# Patient Record
Sex: Female | Born: 1970 | Race: White | Hispanic: No | Marital: Single | Smoking: Never smoker
Health system: Southern US, Community
[De-identification: ages and names within clinical notes are randomized; demographics above are authoritative.]

## PROBLEM LIST (undated history)

## (undated) DIAGNOSIS — R87619 Unspecified abnormal cytological findings in specimens from cervix uteri: Secondary | ICD-10-CM

## (undated) DIAGNOSIS — IMO0002 Reserved for concepts with insufficient information to code with codable children: Secondary | ICD-10-CM

## (undated) HISTORY — DX: Unspecified abnormal cytological findings in specimens from cervix uteri: R87.619

## (undated) HISTORY — PX: WISDOM TOOTH EXTRACTION: SHX21

## (undated) HISTORY — PX: TONSILLECTOMY: SUR1361

## (undated) HISTORY — DX: Reserved for concepts with insufficient information to code with codable children: IMO0002

---

## 2004-06-29 ENCOUNTER — Encounter (INDEPENDENT_AMBULATORY_CARE_PROVIDER_SITE_OTHER): Payer: Self-pay | Admitting: Specialist

## 2004-06-29 ENCOUNTER — Other Ambulatory Visit: Admission: RE | Admit: 2004-06-29 | Discharge: 2004-06-29 | Payer: Self-pay | Admitting: Obstetrics and Gynecology

## 2004-06-29 ENCOUNTER — Encounter: Admission: RE | Admit: 2004-06-29 | Discharge: 2004-06-29 | Payer: Self-pay | Admitting: Obstetrics and Gynecology

## 2005-01-11 ENCOUNTER — Encounter: Payer: Self-pay | Admitting: Family Medicine

## 2005-01-11 ENCOUNTER — Ambulatory Visit: Payer: Self-pay | Admitting: Obstetrics and Gynecology

## 2005-11-29 ENCOUNTER — Other Ambulatory Visit: Admission: RE | Admit: 2005-11-29 | Discharge: 2005-11-29 | Payer: Self-pay | Admitting: Family Medicine

## 2005-11-29 ENCOUNTER — Ambulatory Visit: Payer: Self-pay | Admitting: Family Medicine

## 2005-11-29 ENCOUNTER — Encounter (INDEPENDENT_AMBULATORY_CARE_PROVIDER_SITE_OTHER): Payer: Self-pay | Admitting: Specialist

## 2006-07-11 ENCOUNTER — Encounter (INDEPENDENT_AMBULATORY_CARE_PROVIDER_SITE_OTHER): Payer: Self-pay | Admitting: Obstetrics & Gynecology

## 2006-07-11 ENCOUNTER — Ambulatory Visit: Payer: Self-pay | Admitting: Obstetrics & Gynecology

## 2007-08-06 ENCOUNTER — Encounter: Payer: Self-pay | Admitting: Obstetrics and Gynecology

## 2007-08-06 ENCOUNTER — Ambulatory Visit: Payer: Self-pay | Admitting: Obstetrics and Gynecology

## 2007-08-07 ENCOUNTER — Ambulatory Visit (HOSPITAL_COMMUNITY): Admission: RE | Admit: 2007-08-07 | Discharge: 2007-08-07 | Payer: Self-pay | Admitting: Family Medicine

## 2007-11-13 IMAGING — US US TRANSVAGINAL NON-OB
1 series · 14 of 25 positions shown · non-contrast
Comparison: none

CLINICAL DATA: Pelvic pain.  Probable adenomyosis, LMP 08/06/07.
 TRANSABDOMINAL AND TRANSVAGINAL PELVIC ULTRASOUND:
TECHNIQUE: Both transabdominal and transvaginal ultrasound examinations of the pelvis were performed including evaluation of the uterus, ovaries, adnexal regions, and pelvic cul-de-sac.

[Series 1: us transvaginal non-ob · 0.24mm/px · 14 of 38 slices shown]
[im 1/38]
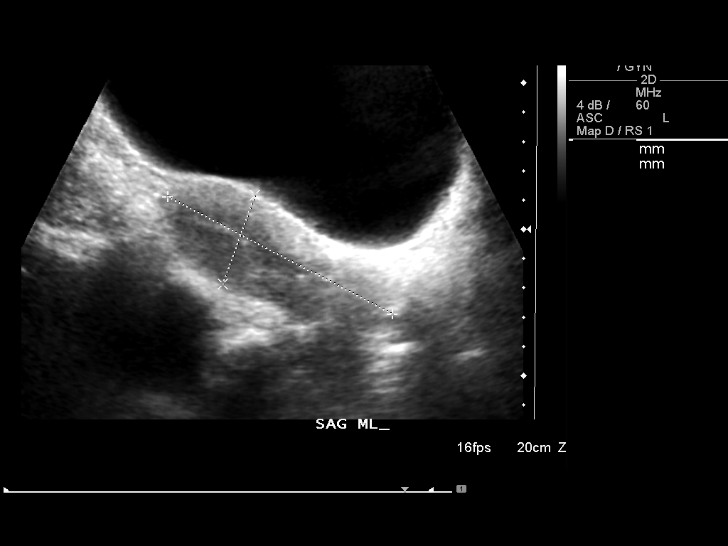
[im 4/38]
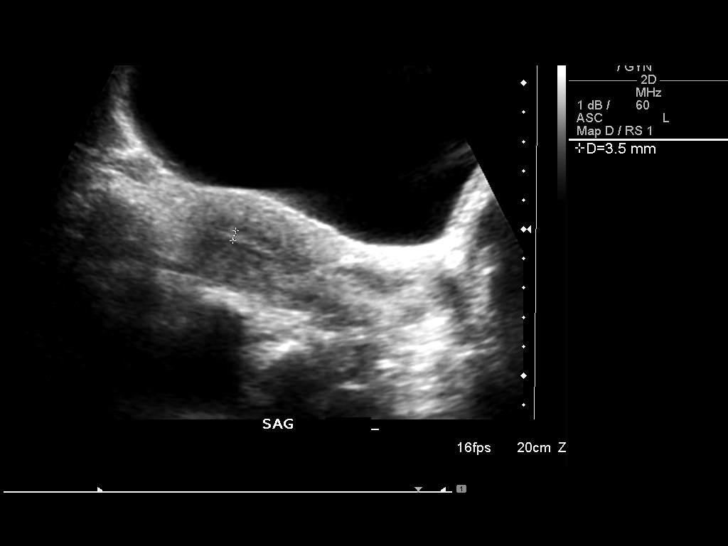
[im 7/38]
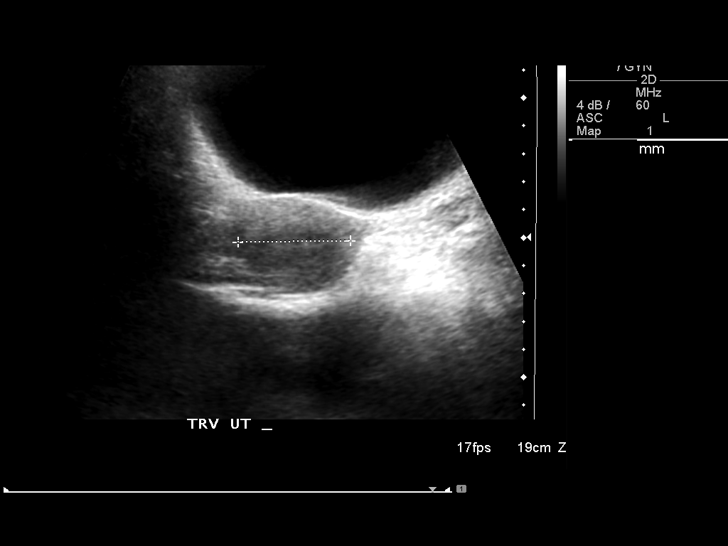
[im 10/38]
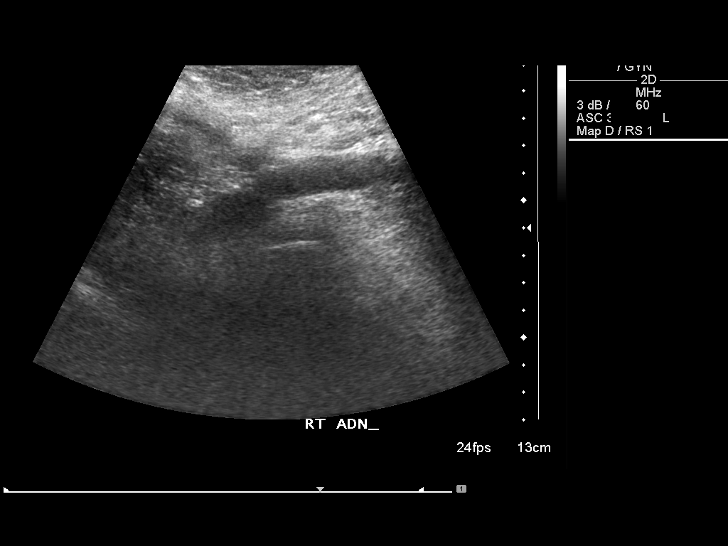
[im 13/38]
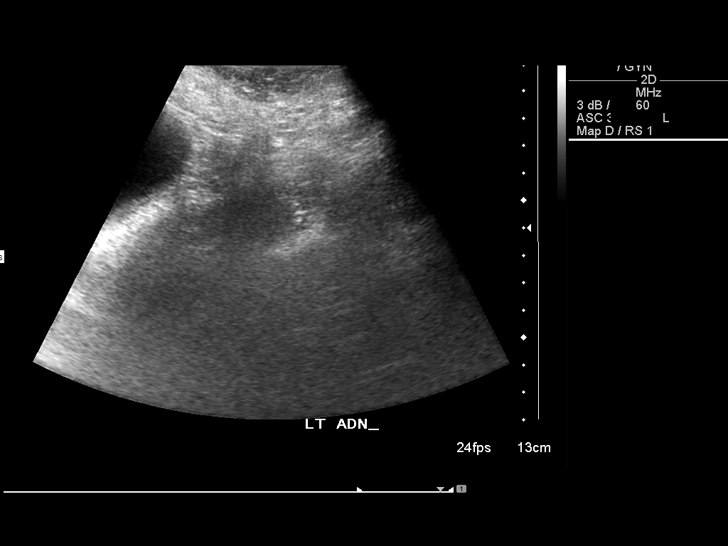
[im 14/38]
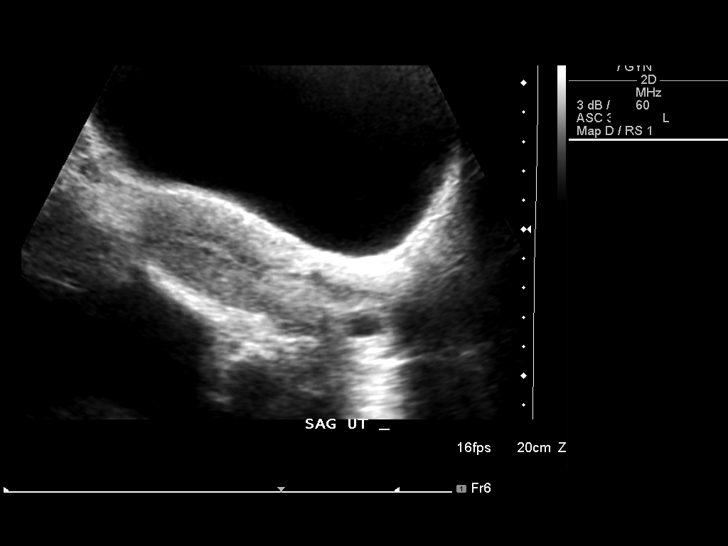
[im 17/38]
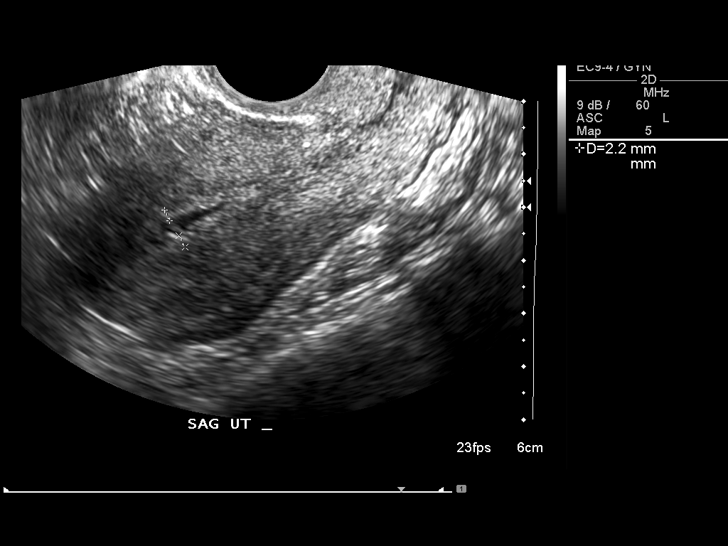
[im 21/38]
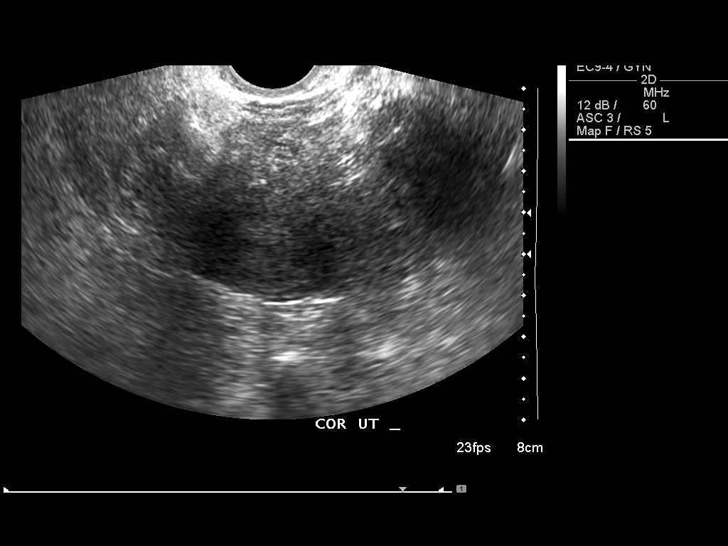
[im 24/38]
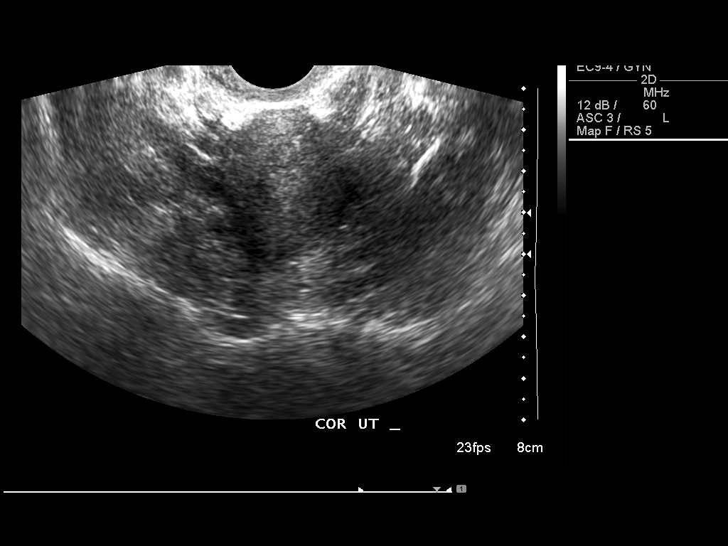
[im 25/38]
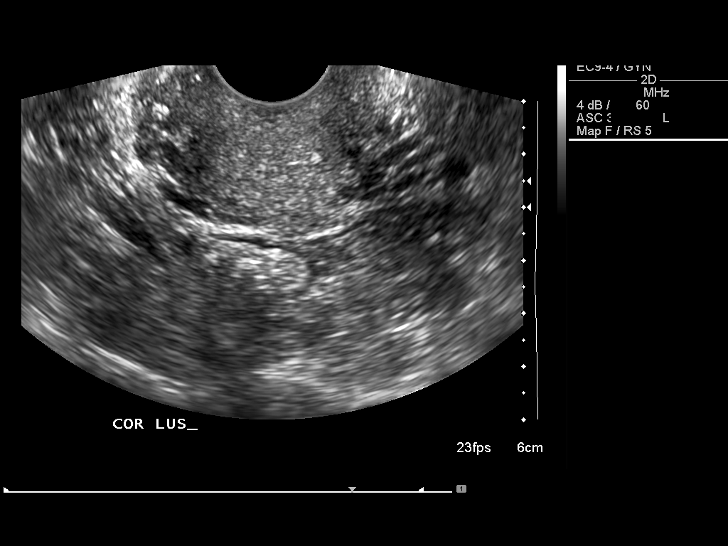
[im 28/38]
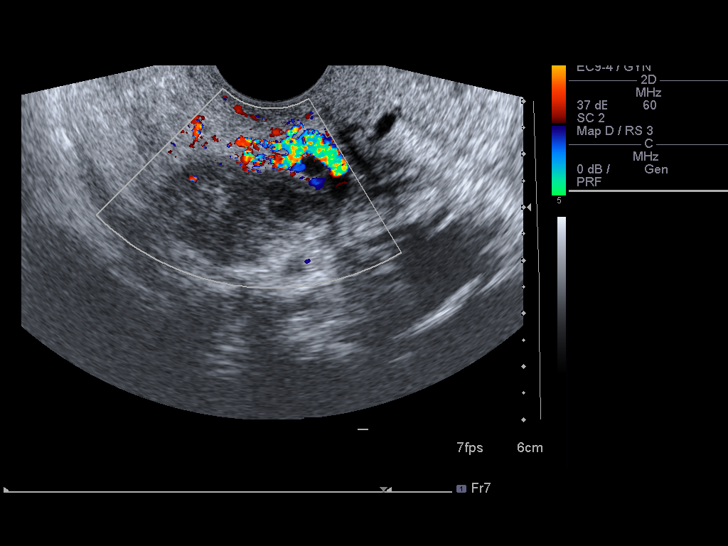
[im 31/38]
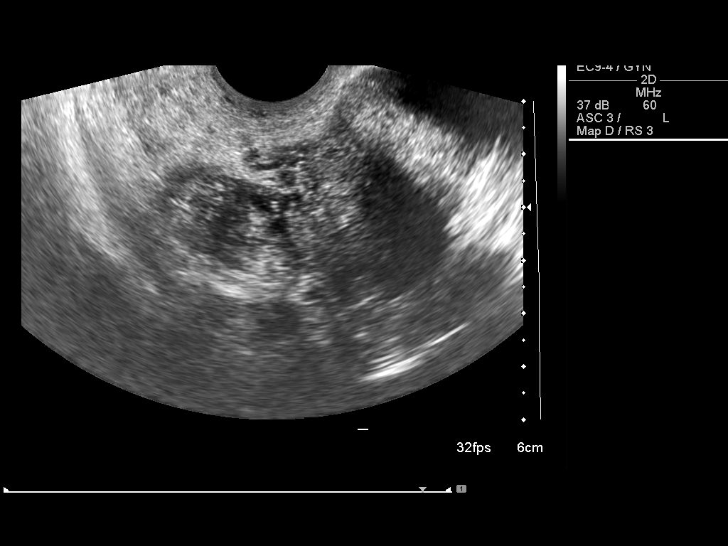
[im 34/38]
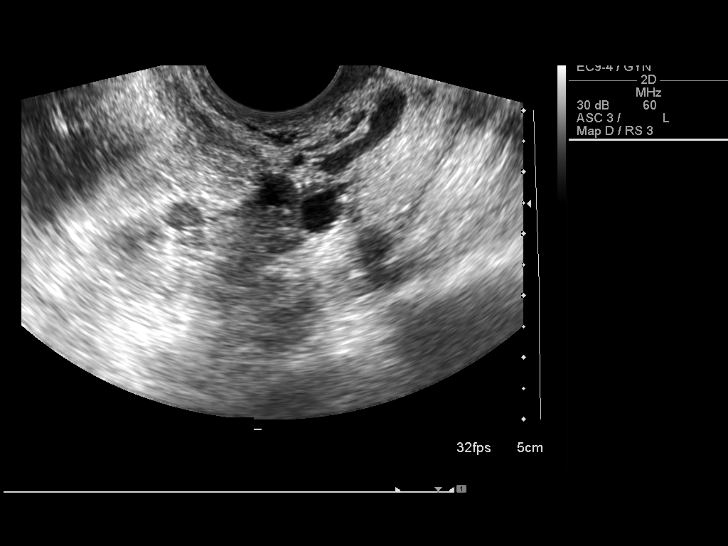
[im 38/38]
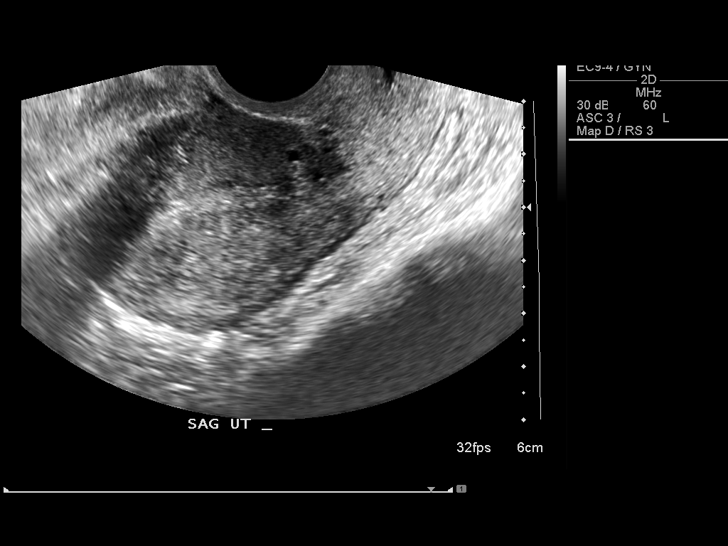

[14 of 25 positions shown; findings below may reference images not displayed]

FINDINGS: Multiple images of the uterus and adnexa were obtained using transabdominal and endovaginal approaches.   The uterus has a sagittal length of 7.3 cm, an AP width of 4.0 cm and a transverse width of 3.5 cm.   A homogeneous uterine myometrium is seen.   The endometrial canal is thin and echogenic with an AP width of 4.7 cm.  A minimal amount of fluid is identified in the endometrial canal correlating with the patient?s current menstrual status and LMP of 08/06/07.   No areas of focal thickening and inhomogeneity are apparent. 
 Both ovaries are seen and have a normal appearance with the right ovary measuring 3.0 x 1.6 x 2.0 cm and the left ovary measures 2.6 x 1.3 x 1.7 cm.  No cul-de-sac or periovarian fluid is seen.  No separate adnexal masses are noted.
IMPRESSION: Normal pelvic ultrasound.  No sonographic signs to suggest adenomyosis.

## 2009-07-13 ENCOUNTER — Ambulatory Visit: Payer: Self-pay | Admitting: Obstetrics and Gynecology

## 2009-07-13 ENCOUNTER — Other Ambulatory Visit: Admission: RE | Admit: 2009-07-13 | Discharge: 2009-07-13 | Payer: Self-pay | Admitting: Obstetrics and Gynecology

## 2009-07-27 ENCOUNTER — Ambulatory Visit: Payer: Self-pay | Admitting: Obstetrics & Gynecology

## 2009-11-16 ENCOUNTER — Ambulatory Visit: Payer: Self-pay | Admitting: Obstetrics and Gynecology

## 2010-01-25 ENCOUNTER — Ambulatory Visit: Payer: Self-pay | Admitting: Obstetrics and Gynecology

## 2010-01-26 ENCOUNTER — Encounter: Payer: Self-pay | Admitting: Obstetrics and Gynecology

## 2010-02-08 ENCOUNTER — Ambulatory Visit: Payer: Self-pay | Admitting: Obstetrics and Gynecology

## 2010-02-09 ENCOUNTER — Encounter: Payer: Self-pay | Admitting: Obstetrics and Gynecology

## 2010-03-29 ENCOUNTER — Ambulatory Visit: Payer: Self-pay | Admitting: Obstetrics and Gynecology

## 2010-05-22 ENCOUNTER — Ambulatory Visit: Payer: Self-pay | Admitting: Family Medicine

## 2010-05-23 ENCOUNTER — Encounter: Payer: Self-pay | Admitting: Family Medicine

## 2010-06-05 ENCOUNTER — Ambulatory Visit: Payer: Self-pay | Admitting: Obstetrics & Gynecology

## 2010-06-05 ENCOUNTER — Encounter: Payer: Self-pay | Admitting: Obstetrics and Gynecology

## 2010-06-05 LAB — CONVERTED CEMR LAB
Hemoglobin: 13.8 g/dL (ref 12.0–15.0)
RDW: 13.9 % (ref 11.5–15.5)
WBC: 7.8 10*3/uL (ref 4.0–10.5)

## 2010-06-06 ENCOUNTER — Ambulatory Visit (HOSPITAL_COMMUNITY): Admission: RE | Admit: 2010-06-06 | Discharge: 2010-06-06 | Payer: Self-pay | Admitting: Family Medicine

## 2010-09-12 IMAGING — US US RENAL
1 series · 14 of 25 positions shown · non-contrast
Comparison: None.

CLINICAL DATA: UTI

RENAL/URINARY TRACT ULTRASOUND COMPLETE

[Series 1: us renal · 14 of 27 slices shown]
[im 1/27]
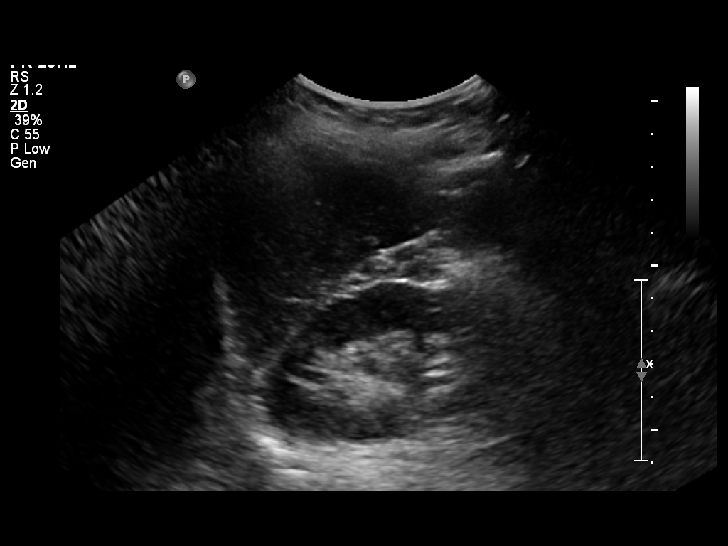
[im 3/27]
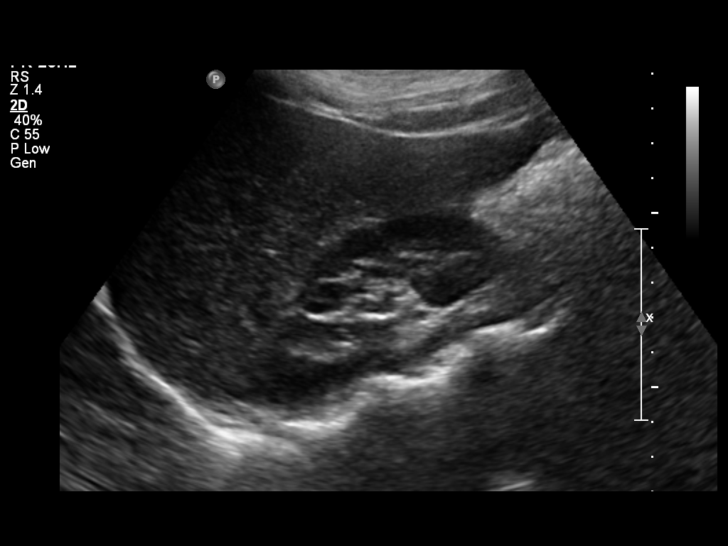
[im 5/27]
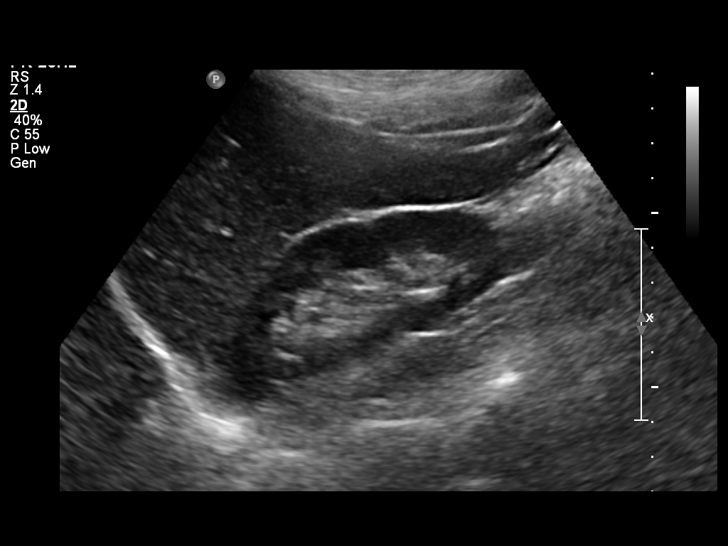
[im 7/27]
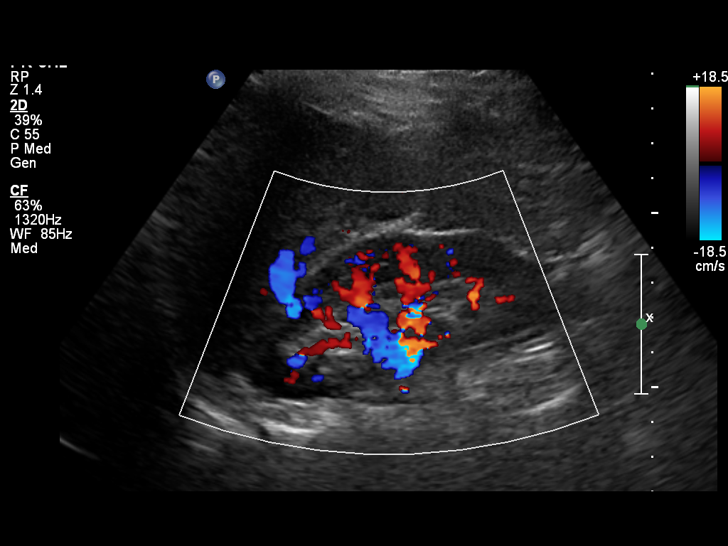
[im 9/27]
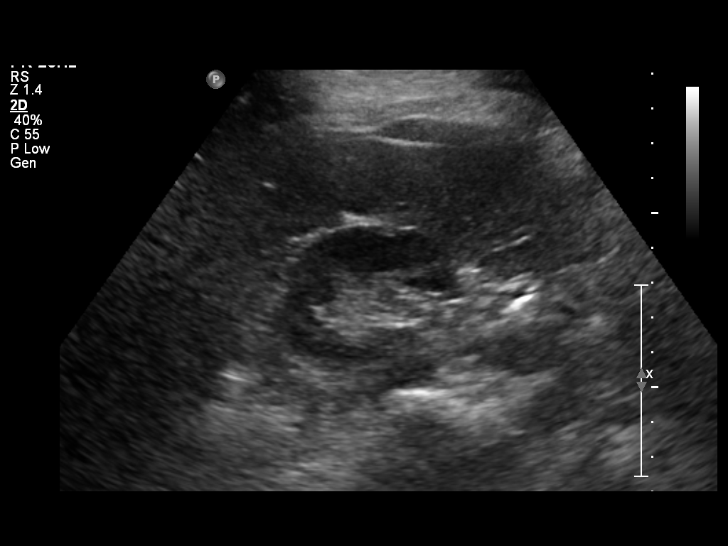
[im 10/27]
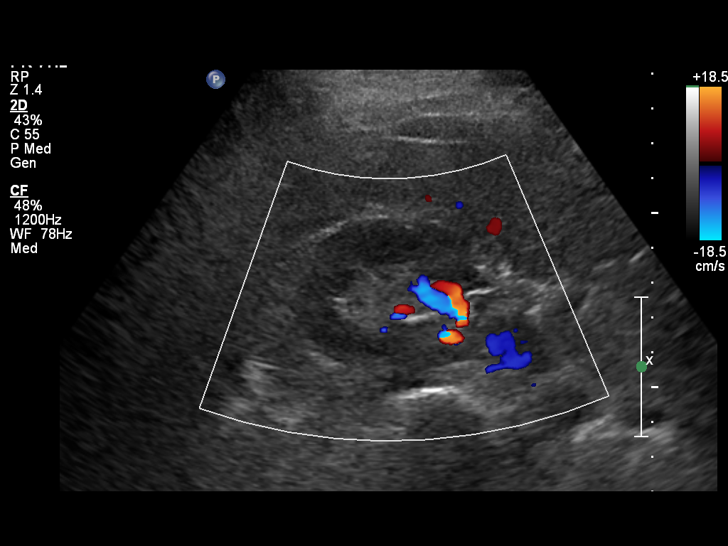
[im 12/27]
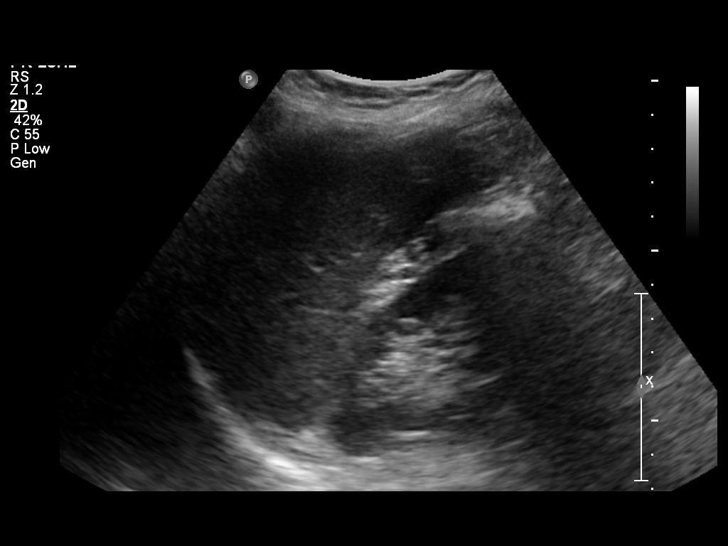
[im 15/27]
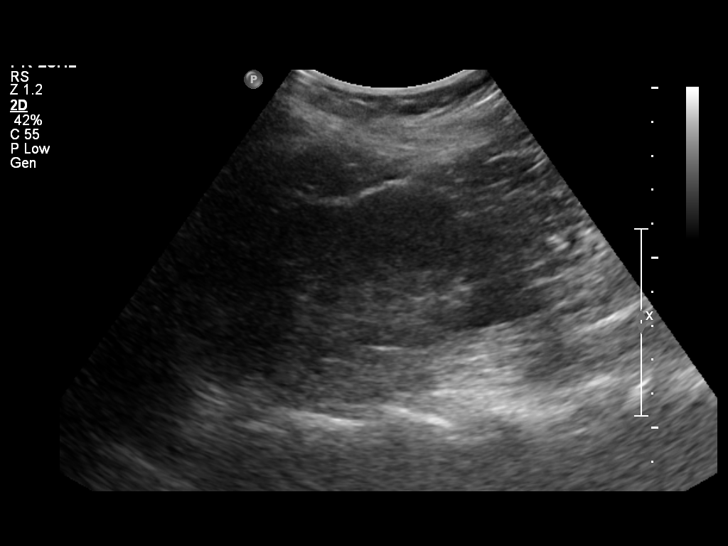
[im 17/27]
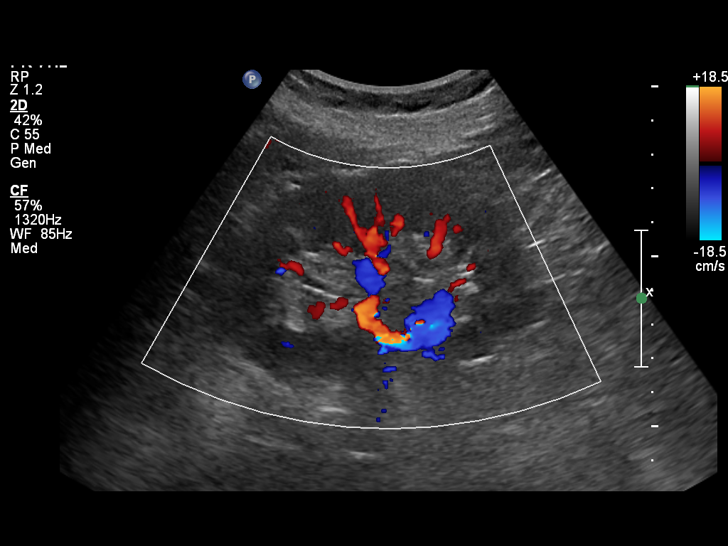
[im 18/27]
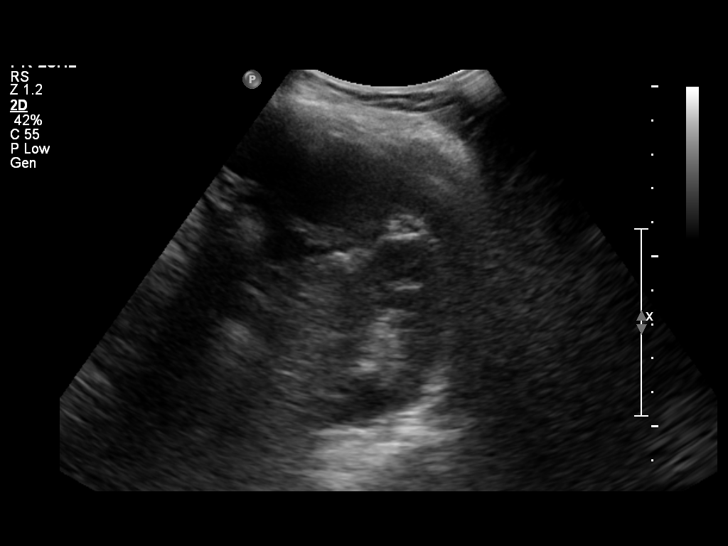
[im 20/27]
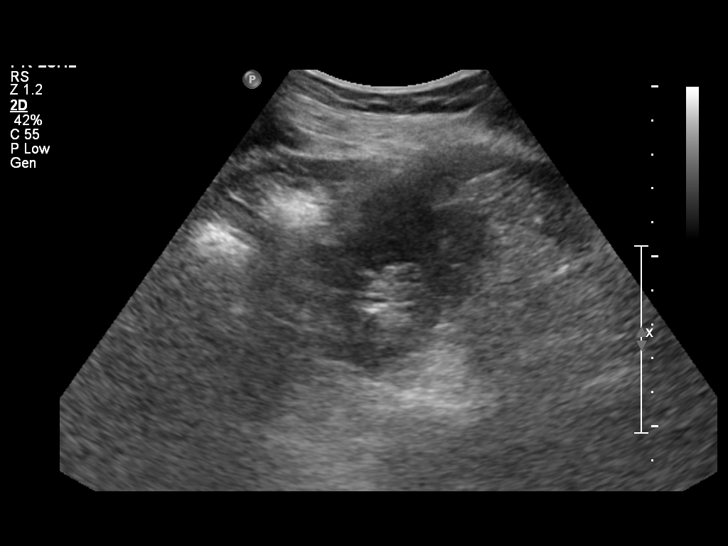
[im 22/27]
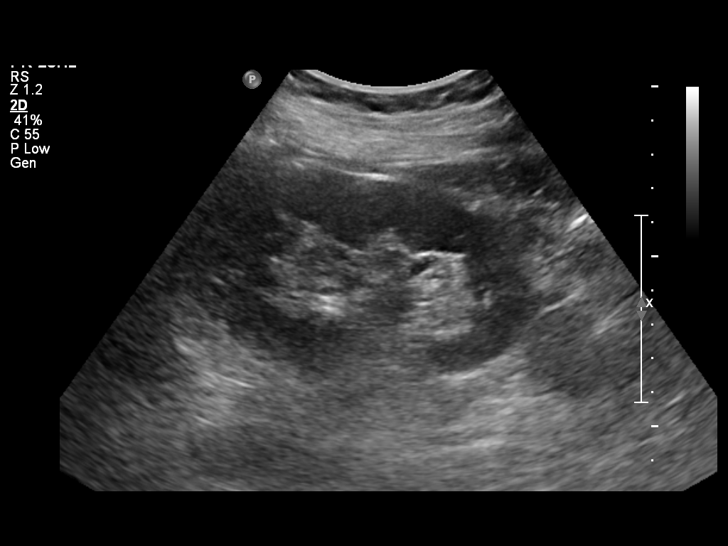
[im 24/27]
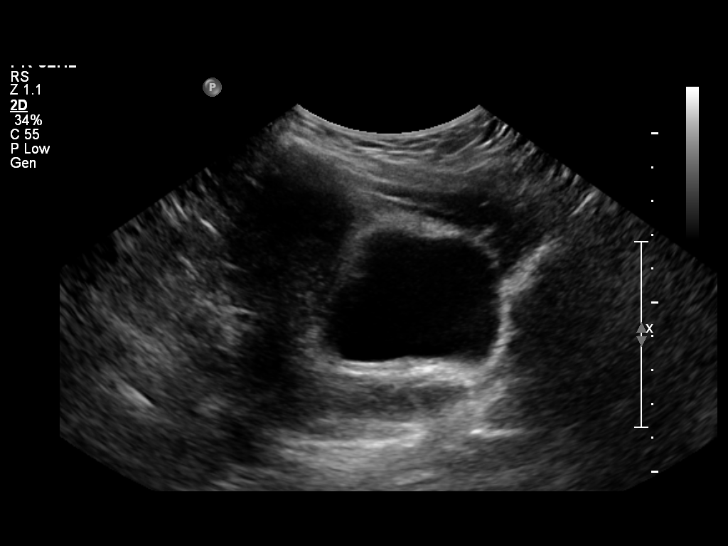
[im 27/27]
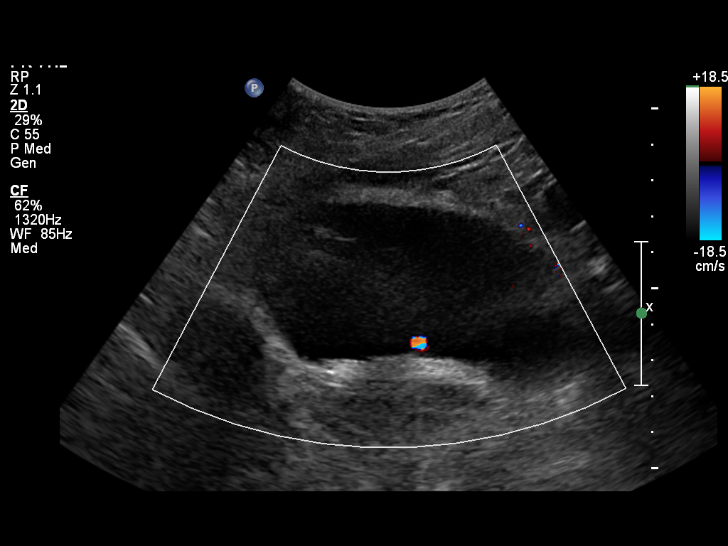

[14 of 25 positions shown; findings below may reference images not displayed]

FINDINGS: Right Kidney:  Normal in size and parenchymal echogenicity.  No
evidence of mass or hydronephrosis.

Left Kidney:  Normal in size and parenchymal echogenicity.  No
evidence of mass or hydronephrosis.

Bladder:  Appears normal for degree of bladder distention.
IMPRESSION: Normal study.

## 2010-12-03 ENCOUNTER — Encounter: Payer: Self-pay | Admitting: *Deleted

## 2011-01-17 ENCOUNTER — Ambulatory Visit: Payer: Self-pay | Admitting: Obstetrics and Gynecology

## 2011-01-27 LAB — POCT URINALYSIS DIP (DEVICE)
Bilirubin Urine: NEGATIVE
Glucose, UA: 100 mg/dL — AB
Specific Gravity, Urine: <=1.005 (ref 1.005–1.030)
pH: 5 (ref 5.0–8.0)

## 2011-01-28 LAB — POCT URINALYSIS DIP (DEVICE)
Bilirubin Urine: NEGATIVE
Glucose, UA: NEGATIVE mg/dL
Ketones, ur: NEGATIVE mg/dL
Nitrite: NEGATIVE
Protein, ur: 30 mg/dL — AB
Specific Gravity, Urine: 1.02 (ref 1.005–1.030)
Specific Gravity, Urine: 1.015 (ref 1.005–1.030)
pH: 5 (ref 5.0–8.0)
pH: 5 (ref 5.0–8.0)

## 2011-01-29 LAB — POCT URINALYSIS DIP (DEVICE)
Bilirubin Urine: NEGATIVE
Ketones, ur: NEGATIVE mg/dL
Specific Gravity, Urine: >=1.03 (ref 1.005–1.030)
pH: 5 (ref 5.0–8.0)

## 2011-02-04 LAB — POCT URINALYSIS DIP (DEVICE)
Bilirubin Urine: NEGATIVE
Bilirubin Urine: NEGATIVE
Glucose, UA: 100 mg/dL — AB
Glucose, UA: 100 mg/dL — AB
Ketones, ur: NEGATIVE mg/dL
Protein, ur: 30 mg/dL — AB
Specific Gravity, Urine: <=1.005 (ref 1.005–1.030)
pH: 5 (ref 5.0–8.0)

## 2011-02-16 LAB — POCT PREGNANCY, URINE: Preg Test, Ur: NEGATIVE

## 2011-03-27 NOTE — Group Therapy Note (Signed)
NAME:  ARIES, Ashley NO.:  [id number]   MEDICAL RECORD NO.:  [id number]          PATIENT TYPE:  WOC   LOCATION:  [facility]                   FACILITY:  [facility]   PHYSICIAN:  Argentina Donovan, MD        DATE OF BIRTH:  1970/12/15   DATE OF SERVICE:  07/13/2009                                  CLINIC NOTE   The patient is a 40 year old Caucasian female gravida 3, para 2-0-1-2  who apparently had a colposcopy some years ago and turned out to be  okay.  She had a Pap smear here in July 2009, which was normal and then  recently at [facility] and had one that showed  LSIL.  She comes in for colposcopy here.  She has had that once in the  past.  She understands what the procedure is.  She was delivered by two  cesarean section.  She has never had a vaginal birth.  In addition to  this, her para more apparently has contacted herpes and she wants to  make sure she has not been infected, so we are going to do a screening  for that today.  Examination of the cervix using a Graves speculum,  cervix is clean and nulliparous looking with a stenotic cervix that I  could not get the curette into dual endometrial curettage nor see the  transition zone completely, so I used a Cytobrush to sample the  endocervix and the exocervix was treated with 3% acetic acid with only  one little area of acetowhite showing the rest seem to be normal.  We  took a little biopsy of that area; however, this is fairly normal  looking cervix, although I have to consider the colposcopy to be  unsatisfactory.  First, the patient will return in 2 weeks for results  of the colposcopy, as well as the results of her HSV screening.           ______________________________  Argentina Donovan, MD     PR/MEDQ  D:  07/13/2009  T:  07/14/2009  Job:  [id number]

## 2011-03-27 NOTE — Group Therapy Note (Signed)
NAME:  Ashley Fitzpatrick, Ashley Fitzpatrick NO.:  [id number]   MEDICAL RECORD NO.:  [id number]          PATIENT TYPE:  WOC   LOCATION:  [facility]                   FACILITY:  [facility]   PHYSICIAN:  Argentina Donovan, MD        DATE OF BIRTH:  1971/02/11   DATE OF SERVICE:  08/06/2007                                  CLINIC NOTE   The patient is a 40 year old Caucasian female gravida 3, para 2-0-1-2  with two children age 20 and 71 who has always been good health, began  having severe disabling menstrual cramps about 5 months ago, although  the bleeding has not increased.  The pain radiates into her back and  down into her thighs.  She has had a history of two cesarean sections.   PHYSICAL EXAMINATION:  Her breasts are symmetrical.  No dominant masses.  ABDOMEN:  Soft, nontender.  No masses or organomegaly.  External genitalia is normal.  BUS is within normal limits.  Vagina is  clean and well rugated.  Cervix is clean and well epithelized and  nulliparous.  The uterus is anterior and of normal size, shape,  consistency.  The adnexa could not be well outlined.   I have discussed with the patient that the possibility is that she has  is developing adenomyosis and because there is no other problems that we  can find, I am getting a pelvic ultrasound as well as placing her for 3  months on oral contraceptives, i.e. Loestrin 24.  I have told her that  the likelihood of adenomyosis probably her pain would not be helped much  by the oral contraceptives, but if it was, I believe it would tell us it  was more hormonal than anything else and that if she decides that the  pain is not being helped that the only real treatment for this would be  hysterectomy.   The impression is a 40-month onset of disabling dysmenorrhea, probable  adenomyosis.           ______________________________  Argentina Donovan, MD     PR/MEDQ  D:  08/06/2007  T:  08/07/2007  Job:  [id number]

## 2011-03-30 NOTE — Group Therapy Note (Signed)
NAME:  NYSA, SARIN NO.:  [id number]   MEDICAL RECORD NO.:  [id number]          PATIENT TYPE:  WOC   LOCATION:  [facility]                   FACILITY:  [facility]   PHYSICIAN:  Tinnie Gens, MD        DATE OF BIRTH:  Jul 28, 1971   DATE OF SERVICE:                                    CLINIC NOTE   CHIEF COMPLAINT:  Follow-up Pap.   HISTORY OF PRESENT ILLNESS:  The patient is a 40 year old gravida 3, para 2,  0-1-2, who has previously been seen for abnormal Pap.  She underwent  colposcopy with benign findings and is here for a six- month followup.   The patient also complains today of some mild cramping with her periods.  She is currently on no birth control, just uses condoms.   PHYSICAL EXAMINATION:  GENERAL:  Vital signs are as noted on the chart.  She  is a well-developed,well-nourished black female in no acute distress.  ABDOMEN:  Soft, nontender and nondistended.  GU:  Normal external genitalia.  Vagina is pink and rugated.  Cervix is  parous with no lesions.  Uterus is small and anteverted.   IMPRESSION:  1.  History of abnormal Pap.  2.  Dysmenorrhea.   PLAN:  1.  Test her today with high-risk HPV typing.  2.  Mircette 1 p.o. daily, #1 pack, unlimited refills.  3.  Follow up in six months for a Pap.      TP/MEDQ  D:  01/11/2005  T:  01/12/2005  Job:  [id number]

## 2011-03-30 NOTE — Group Therapy Note (Signed)
NAME:  Ashley Fitzpatrick, MONRROY NO.:  [id number]   MEDICAL RECORD NO.:  [id number]          PATIENT TYPE:  WOC   LOCATION:  [facility]                   [facility]ACILITY:  [facility]   PHYSICIAN:  Dorthula Perfect, MD     DATE OF BIRTH:  [birth year]   DATE OF SERVICE:  07/11/2006                                    CLINIC NOTE   A 40 year old white female gravida 3, para 2, aborta 1, last menstrual  period a few weeks ago, is here for repeat Pap smear.  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  Her  last Pap smear, she had a Pap smear March of 2006 which was normal.  That  Pap smear also showed high risk HPV.  She returns for repeat Pap smear.   PAST MEDICAL HISTORY:  Operations:  Two cesarean sections.   PHYSICAL EXAMINATION:  ABDOMEN:  Completely negative.  PELVIC:  External genitalia, BUS negative.  Vaginal vault epithelialized as  was the cervix.  Uterus in midline, normal size and shape.  Ovaries are  normal.   IMPRESSION:  History of abnormal Pap smear.   DISPOSITION:  1. Pap smear.  2. Recommend repeat Pap smears every six months for the next few years      because of the high rish HPV factor.           ______________________________  Dorthula Perfect, MD     ER/MEDQ  D:  07/11/2006  T:  07/12/2006  Job:  [id number]

## 2011-05-09 ENCOUNTER — Other Ambulatory Visit: Payer: Self-pay | Admitting: Family Medicine

## 2011-05-09 NOTE — Telephone Encounter (Signed)
Refill request

## 2011-06-08 DIAGNOSIS — IMO0002 Reserved for concepts with insufficient information to code with codable children: Secondary | ICD-10-CM | POA: Insufficient documentation

## 2011-06-08 DIAGNOSIS — N390 Urinary tract infection, site not specified: Secondary | ICD-10-CM | POA: Insufficient documentation

## 2011-06-21 ENCOUNTER — Ambulatory Visit: Payer: Self-pay | Admitting: Family Medicine

## 2011-08-23 LAB — POCT URINALYSIS DIP (DEVICE)
Nitrite: NEGATIVE
Protein, ur: NEGATIVE
Urobilinogen, UA: 1
pH: 6

## 2011-10-29 ENCOUNTER — Other Ambulatory Visit: Payer: Self-pay | Admitting: *Deleted

## 2011-10-29 NOTE — Telephone Encounter (Signed)
Pt left message requesting a change to her existing Rx. Please call back.

## 2011-10-30 NOTE — Telephone Encounter (Signed)
Called pt and left message that we would like detailed information regarding the name of medication she wants to be changed and the reason. She may leave that info on our nurse voice mail. Also, please specify the pharmacy she would like to use.

## 2011-10-31 NOTE — Telephone Encounter (Signed)
Pt is taking Acyclovir 400mg  po bid and does not seem to be helping. Pt has been taking since Maryjean of this year. Can this dose be increased.

## 2011-10-31 NOTE — Telephone Encounter (Signed)
Pt left new message stating that it is the Acyclovir that she wants changed because it does not seem to work. She requested an increase in the current dosage or another medication to be ordered. She gave no other details.

## 2011-11-01 NOTE — Telephone Encounter (Signed)
What is not working---did we prescribe this medication, if so when, and who saw her last?  Pull the paper chart or pull her visits up in EPIC.  This is the standard prophylactic dose of this medicine.  It is not full treatment, does she need that?

## 2011-11-02 MED ORDER — ACYCLOVIR 400 MG PO TABS: 400.0000 mg | ORAL_TABLET | Freq: Two times a day (BID) | ORAL | Status: DC

## 2011-11-02 NOTE — Telephone Encounter (Signed)
Spoke w/pt regarding her medication concerns. She states that when she stops taking the acyclovir, she begins having sx of outbreak on her lip. She recently got a blister and then resumed the medication and now the blister is almost gone. I explained to pt that her medication dosage is for prevention and she needs to take it daily without interruption. If she gets an outbreak she can call us and we will prescribe a different dosage for treatment. Pt explains that she was not previously told that. I apologized for any confusion there may have been and she had no further questions. Pt stated that she needed a refill. I told her that I will send the refill. Pt voiced understanding.

## 2012-01-24 ENCOUNTER — Ambulatory Visit (INDEPENDENT_AMBULATORY_CARE_PROVIDER_SITE_OTHER): Payer: Self-pay | Admitting: Physician Assistant

## 2012-01-24 ENCOUNTER — Encounter: Payer: Self-pay | Admitting: Physician Assistant

## 2012-01-24 VITALS — BP 101/69 | HR 73 | Temp 98.7°F | Ht 62.0 in | Wt 145.6 lb

## 2012-01-24 DIAGNOSIS — R5383 Other fatigue: Secondary | ICD-10-CM

## 2012-01-24 DIAGNOSIS — R300 Dysuria: Secondary | ICD-10-CM

## 2012-01-24 DIAGNOSIS — R87612 Low grade squamous intraepithelial lesion on cytologic smear of cervix (LGSIL): Secondary | ICD-10-CM

## 2012-01-24 DIAGNOSIS — Z01419 Encounter for gynecological examination (general) (routine) without abnormal findings: Secondary | ICD-10-CM

## 2012-01-24 DIAGNOSIS — IMO0002 Reserved for concepts with insufficient information to code with codable children: Secondary | ICD-10-CM

## 2012-01-24 LAB — CBC
MCH: 32 pg (ref 26.0–34.0)
MCHC: 33.6 g/dL (ref 30.0–36.0)
Platelets: 312 10*3/uL (ref 150–400)

## 2012-01-24 NOTE — Patient Instructions (Signed)
Pap Test A Pap test is a sampling of cells from a woman's cervix. The cervix is the opening between the vagina (birth canal) and the uterus (the bottom part of the womb). The cells are scraped from the cervix during a pelvic exam. These cells are then looked at under a microscope to see if the cells are normal or to see if a cancer is developing or there are changes that suggest a cancer will develop. Cervical dysplasia is a condition in which a woman has abnormal changes in the top layer of cells of her cervix. These changes are an early sign that cervical cancer may develop. Pap tests also look for the human papilloma virus (HPV) because it has 4 types that are responsible for 70% of cervical cancer. Infections can also be found during a Pap test such as bacteria, fungus, protozoa and viruses.  Cervical cancer is harder to treat and less likely to have a good outcome if left untreated. Catching the disease at an early stage leads to a better outcome. Since the Pap test was introduced 60 years ago, deaths from cervical cancer have decreased by 70%. Every woman should keep up to date with Pap tests. RISK FACTORS FOR CERVICAL CANCER INCLUDE:   Becoming sexually active before age 18.   Being the daughter of a woman who took diethylstilbestrol (DES) during pregnancy.   Having a sexual partner who has or has had cancer of the penis.   Having a sexual partner whose past partner had cervical cancer or cervical dysplasia (early cell changes which suggest a cancer may develop).   Having a weakened immune system. An example would be HIV or other immunodeficiency disorder.   Having had a sexually transmitted infection such as chlamydia, gonorrhea or HPV.   Having had an abnormal Pap or cancer of the vagina or vulva.   Having had more than one sexual partner.   A history of cervical cancer in a woman's sister or mother.   Not using condoms with new sexual partners.   Smoking.  WHO SHOULD HAVE PAP  TESTS  A Pap test is done to screen for cervical cancer.   The first Pap test should be done at age 21.   Between ages 21 and 29, Pap tests are repeated every 2 years.   Beginning at age 30, you are advised to have a Pap test every 3 years as long as your past 3 Pap tests have been normal.   Some women have medical problems that increase the chance of getting cervical cancer. Talk to your caregiver about these problems. It is especially important to talk to your caregiver if a new problem develops soon after your last Pap test. In these cases, your caregiver may recommend more frequent screening and Pap tests.   The above recommendations are the same for women who have or have not gotten the vaccine for HPV (Human Papillomavirus).   If you had a hysterectomy for a problem that was not a cancer or a condition that could lead to cancer, then you no longer need Pap tests. However, even if you no longer need a Pap test, a regular exam is a good idea to make sure no other problems are starting.    If you are between ages 65 and 70, and you have had normal Pap tests going back 10 years, you no longer need Pap tests. However, even if you no longer need a Pap test, a regular exam is a good idea   to make sure no other problems are starting.    If you have had past treatment for cervical cancer or a condition that could lead to cancer, you need Pap tests and screening for cancer for at least 20 years after your treatment.   If Pap tests have been discontinued, risk factors (such as a new sexual partner) need to be re-assessed to determine if screening should be resumed.   Some women may need screenings more often if they are at high risk for cervical cancer.  PREPARATION FOR A PAP TEST A Pap test should be performed during the weeks before the start of menstruation. Women should not douche or have sexual intercourse for 24 hours before the test. No vaginal creams, diaphragms, or tampons should be  used for 24 hours before the test. To minimize discomfort, a woman should empty her bladder just before the exam. TAKING THE PAP TEST The caregiver will perform a pelvic exam. A metal or plastic instrument (speculum) is placed in the vagina. This is done before your caregiver does a bimanual exam of your internal female organs. This instrument allows your caregiver to see the inside of the vagina and look at the cervix. A small, sterile brush is used to take a sample of cells from the internal opening of the cervix. A small wooden spatula is used to scrape the outside of the cervix. Neither of these two methods to collect cells will cause you pain. These two scrapings are placed on a glass slide or in a small bottle filled with a special liquid. The cells are looked at later under a microscope in a lab. A specialist will look at these cells and determine if the cells are normal. RESULTS OF YOUR PAP TEST  A healthy Pap test shows no abnormal cells or evidence of inflammation.   The presence of abnormally growing cells on the surface of the cervix may be reported as an abnormal Pap test. Different categories of findings are used to describe your Pap test. Your caregiver will go over the importance of these findings with you. The caregiver will then determine what follow-up is needed or when you should have your next pap test.   If you have had two or more abnormal Pap tests:   You may be asked to have a colposcopy. This is a test in which the cervix is viewed with a special lighted microscope.   A cervical tissue sample (biopsy) may also be needed. This involves taking a small tissue sample from the cervix. The sample is looked at under a microscope to find the cause of the abnormal cells. Make sure you find out the results of the Pap test. If you have not received the results within two weeks, contact your caregiver's office for the results. Do not assume everything is normal if you have not heard from  your caregiver or medical facility. It is important to follow up on all of your test results.  Document Released: 01/19/2003 Document Revised: 10/18/2011 Document Reviewed: 10/23/2011 [employer] Patient Information 2012 [employer]. 

## 2012-01-24 NOTE — Progress Notes (Signed)
Chief Complaint:  Gynecologic Exam   Ashley Fitzpatrick is  41 y.o. [id number].  Patient's last menstrual period was 12/18/2011..   She presents complaining of Gynecologic Exam  Reports persistant dysuria since this weekend and "feeling tired" all the time for the last 1-2 months.  Obstetrical/Gynecological History: Pertinent Gynecological History: Menses: flow is moderate, irregular occurring approximately every 28-30 days without intermenstrual spotting and with minimal cramping Bleeding: normal Contraception: condoms and tubal ligation DES exposure: unknown Blood transfusions: none Sexually transmitted diseases: no past history Previous GYN Procedures: colpo with biopsy  Last mammogram: normal Date: age 74 Last pap: normal Date: 2001   Past Medical History: Past Medical History  Diagnosis Date  . Abnormal Pap smear     Past Surgical History: Past Surgical History  Procedure Date  . Cesarean section     x 2 in [facility]    Family History: Family History  Problem Relation Age of Onset  . Hypertension Father     Social History: History  Substance Use Topics  . Smoking status: Never Smoker   . Smokeless tobacco: Never Used  . Alcohol Use: Yes     social drinking    Allergies:  Allergies  Allergen Reactions  . Sulfa Antibiotics     Review of Systems - History obtained from the patient General ROS: positive for  - fatigue negative for - hot flashes, malaise or night sweats Psychological ROS: negative for - behavioral disorder or depression Hematological and Lymphatic ROS: negative for - bruising, swollen lymph nodes or weight loss Endocrine ROS: negative for - breast changes, hair pattern changes, palpitations or temperature intolerance Breast ROS: negative for breast lumps Respiratory ROS: no cough, shortness of breath, or wheezing Cardiovascular ROS: no chest pain or dyspnea on exertion Gastrointestinal ROS: no abdominal pain, change in bowel habits, or black or bloody  stools Genito-Urinary ROS: positive for - dysuria Neurological ROS: no TIA or stroke symptoms  Physical Exam   Blood pressure 101/69, pulse 73, temperature 98.7 F (37.1 C), temperature source Oral, height 5\' 2"  (1.575 m), weight 145 lb 9.6 oz (66.044 kg), last menstrual period 12/18/2011.  General: General appearance - alert, well appearing, and in no distress, oriented to person, place, and time and normal appearing weight Mental status - alert, oriented to person, place, and time, normal mood, behavior, speech, dress, motor activity, and thought processes, affect appropriate to mood Eyes - left eye normal, right eye normal Nose - normal and patent, no erythema, discharge or polyps Mouth - mucous membranes moist, pharynx normal without lesions Neck - supple, no significant adenopathy Lymphatics - no palpable lymphadenopathy, no hepatosplenomegaly Chest - clear to auscultation, no wheezes, rales or rhonchi, symmetric air entry Heart - normal rate, regular rhythm, normal S1, S2, no murmurs, rubs, clicks or gallops Abdomen - soft, nontender, nondistended, no masses or organomegaly Breasts - right breast normal without mass, skin or nipple changes or axillary nodes, left breast normal without mass, skin or nipple changes or axillary nodes Rectal - no visible warts, no visible ext lesions or hemorroids Back exam - full range of motion, no tenderness, palpable spasm or pain on motion Neurological - alert, oriented, normal speech, no focal findings or movement disorder noted Extremities - peripheral pulses normal, no pedal edema, no clubbing or cyanosis Skin - normal coloration and turgor, no rashes, no suspicious skin lesions noted Focused Gynecological Exam: VULVA: normal appearing vulva with no masses, tenderness or lesions, VAGINA: normal appearing vagina with normal color and  discharge, no lesions, CERVIX: normal appearing cervix without discharge or lesions, UTERUS: uterus is normal size,  shape, consistency and nontender, ADNEXA: normal adnexa in size, nontender and no masses, RECTAL: normal rectal, no masses    Assessment/Plan 1. Routine gynecological examination  Cytology - PAP, MM Digital Diagnostic Bilat  2. LSIL (low grade squamous intraepithelial lesion) on Pap smear  Cytology - PAP  3. Dysuria  Urine culture  4. Fatigue  CBC    Pt to apply for financial assistance and mammo scholarship today. Recommend TSH, Fasting Lipids once financial assistance approved. Referral to [facility] for PCP.  Lavren Lewan E. 01/24/2012,2:56 PM

## 2012-01-25 ENCOUNTER — Other Ambulatory Visit: Payer: Self-pay | Admitting: Physician Assistant

## 2012-01-25 DIAGNOSIS — Z1231 Encounter for screening mammogram for malignant neoplasm of breast: Secondary | ICD-10-CM

## 2012-01-26 LAB — URINE CULTURE: Colony Count: NO GROWTH

## 2012-02-01 NOTE — Progress Notes (Signed)
Addended by: Darrel Hoover on: 02/01/2012 01:26 PM   Modules accepted: Orders

## 2012-02-05 ENCOUNTER — Telehealth: Payer: Self-pay | Admitting: *Deleted

## 2012-02-05 NOTE — Telephone Encounter (Signed)
Pt left message requesting Rx for kidney infection.

## 2012-02-05 NOTE — Telephone Encounter (Signed)
Returned call to pt and left message that her urine culture on 01/24/12 was negative for infection. If she feels she has sx of infection she will need to be seen by her PCP, an urgent care facility or she may call our office back to check for appt availability.

## 2012-02-21 ENCOUNTER — Ambulatory Visit (HOSPITAL_COMMUNITY): Payer: Self-pay

## 2012-03-19 ENCOUNTER — Encounter (HOSPITAL_COMMUNITY): Payer: Self-pay

## 2012-03-26 ENCOUNTER — Telehealth: Payer: Self-pay | Admitting: *Deleted

## 2012-03-26 NOTE — Telephone Encounter (Signed)
Called patient and left a message we are returning your call, please call back and leave a message with how we may help you, what is your question, do you need a refill?

## 2012-03-26 NOTE — Telephone Encounter (Signed)
Patient called and left a message stating she is calling about a prescription

## 2012-03-27 ENCOUNTER — Telehealth: Payer: Self-pay

## 2012-03-27 ENCOUNTER — Other Ambulatory Visit: Payer: Self-pay | Admitting: Obstetrics & Gynecology

## 2012-03-27 MED ORDER — VALACYCLOVIR HCL 500 MG PO TABS: 500.0000 mg | ORAL_TABLET | Freq: Two times a day (BID) | ORAL | Status: DC

## 2012-03-27 NOTE — Telephone Encounter (Signed)
Called pt and pt stated that she needed a refill on her cyclovir and she was requesting a cream for her face (chin) for an outbreak.  Per Dr. Penne Lash pt received valtrex and the symptoms that pt described does not sound like herpes on her chin.  I advised pt to call a dermatologist to evaluate her sporadic outbreak on her chin. Pt stated understanding and had no further questions.

## 2012-03-27 NOTE — Progress Notes (Signed)
Pt feels like she is having herpes outbreaks.  Will put her on daily suppression of Valtrex.  Facial rashes must be addressed by primary care MD.;

## 2012-04-09 ENCOUNTER — Telehealth: Payer: Self-pay | Admitting: *Deleted

## 2012-04-09 MED ORDER — ACYCLOVIR 400 MG PO TABS: 400.0000 mg | ORAL_TABLET | Freq: Two times a day (BID) | ORAL | Status: DC

## 2012-04-09 NOTE — Telephone Encounter (Signed)
Pt left message yesterday stating that the Rx she was given is too expensive. She would like to have what she had previously (Acyclovir).  I obtained Rx order from Dr. Debroah Loop and notified pt that she may pick up from her pharmacy later today. Pt voiced understanding.

## 2012-07-04 ENCOUNTER — Ambulatory Visit: Payer: BC Managed Care – PPO | Admitting: Family Medicine

## 2012-12-10 ENCOUNTER — Other Ambulatory Visit: Payer: Self-pay | Admitting: Obstetrics & Gynecology

## 2012-12-12 ENCOUNTER — Other Ambulatory Visit: Payer: Self-pay

## 2012-12-12 MED ORDER — ACYCLOVIR 400 MG PO TABS: 400.0000 mg | ORAL_TABLET | Freq: Two times a day (BID) | ORAL | Status: DC

## 2012-12-12 NOTE — Telephone Encounter (Signed)
Pt called and stated she wanted to have an refill on her acyclovir.  Per Dr. Erin Fulling pt can have refill.   Called pt and informed pt that the provider has refilled the medication that she requested to please pick it up at her [facility] pharmacy on [address] Pt stated understanding and did not have any further questions.

## 2013-01-30 ENCOUNTER — Ambulatory Visit (INDEPENDENT_AMBULATORY_CARE_PROVIDER_SITE_OTHER): Payer: BC Managed Care – PPO | Admitting: Obstetrics & Gynecology

## 2013-01-30 ENCOUNTER — Encounter: Payer: Self-pay | Admitting: Obstetrics & Gynecology

## 2013-01-30 VITALS — BP 105/69 | HR 73 | Temp 98.7°F | Wt 141.0 lb

## 2013-01-30 DIAGNOSIS — Z0000 Encounter for general adult medical examination without abnormal findings: Secondary | ICD-10-CM

## 2013-01-30 DIAGNOSIS — Z01419 Encounter for gynecological examination (general) (routine) without abnormal findings: Secondary | ICD-10-CM

## 2013-01-30 MED ORDER — ACYCLOVIR 400 MG PO TABS: 400.0000 mg | ORAL_TABLET | Freq: Two times a day (BID) | ORAL | Status: DC

## 2013-01-30 NOTE — Progress Notes (Signed)
Subjective:     Ashley Fitzpatrick is a 42 y.o. female here for a routine exam.  Current complaints: none    Gynecologic History Patient's last menstrual period was 01/24/2013. monthly period, heavy flow for first day, changing tampons hourly with cramping. Takes aleve with some relief.  Contraception: none Pap smear history:  History of abnormal PAP LSIL in 2010, colposcopy 07/13/2009 MICROSCOPIC EXAMINATION AND DIAGNOSIS 1. CERVIX, BIOPSY: BENIGN SQUAMOUS MUCOSA. 2. ENDOCERVICAL CURETTINGS: SCANTY BENIGN ENDOCERVICAL MUCOSA.  Last PAP: 01/24/2012 Diagnosis NEGATIVE FOR INTRAEPITHELIAL LESIONS OR MALIGNANCY.  Last mammogram: April 2013 . Results were: normal History of STD's: none Family History: maternal great aunt: breast cancer  Obstetric History OB History   Grav Para Term Preterm Abortions TAB SAB Ect Mult Living   3 2 2  1  1   2      # Outc Date GA Lbr Len/2nd Wgt Sex Del Anes PTL Lv   1 TRM            2 TRM            3 SAB               Review of Systems Respiratory: no wheezing or shortness of breath Cardiovascular: no chest pain or shortness of breath Gastrointestinal: no constipation or blood in stool Genitourinary: no vaginal discharge, itching or burning, no dysuria, no hematuria  HEENT: cold sores    Objective:     Physical Examination: General appearance - alert, well appearing, and in no distress Chest - clear to auscultation, no wheezes, rales or rhonchi, symmetric air entry Heart - normal rate, regular rhythm, normal S1, S2, no murmurs, rubs, clicks or gallops Abdomen - soft, nontender, nondistended, no masses or organomegaly Breasts - breasts appear normal, no suspicious masses, no skin or nipple changes or axillary nodes Pelvic - normal external genitalia, vulva, vagina, cervix, uterus and adnexa Extremities - no pedal edema, no clubbing or cyanosis     Assessment:   42 yo female who presents for annual gynecological exam H/o LSIL PAP with normal  colpo in 2010 and 2 normal PAPs since then.   Plan:  - follow up PAP smear with HPV - mammogram in April - patient not interested in contraception or OCP for dysmenorrhea. Patient to return to clinic if interested.     Attestation of Attending Supervision of Resident: Evaluation and management procedures were performed by the [facility] Resident under my supervision.  I have seen and examined the patient, reviewed the resident's note and chart, and I agree with the management and plan.  Anibal Henderson, M.D. 01/30/2013 10:46 AM

## 2013-01-30 NOTE — Patient Instructions (Addendum)

## 2013-02-09 ENCOUNTER — Telehealth: Payer: Self-pay | Admitting: *Deleted

## 2013-02-09 NOTE — Telephone Encounter (Signed)
Message copied by Gerome Apley on Mon Feb 09, 2013  1:22 PM ------      Message from: Willodean Rosenthal      Created: Fri Feb 06, 2013  1:58 PM       Please call pt...she has AGUS on her pap.  She needs a colpo and an Endo Bx,            Thx,      clh-S ------

## 2013-02-11 ENCOUNTER — Telehealth: Payer: Self-pay | Admitting: General Practice

## 2013-02-11 NOTE — Telephone Encounter (Signed)
Called patient and informed her of results and recommendations. Told her appt was already scheduled for 4/17 @ 1. Patient verbalized understanding and had no further questions

## 2013-02-11 NOTE — Telephone Encounter (Signed)
Message copied by Kathee Delton on Wed Feb 11, 2013  1:48 PM ------      Message from: Odelia Gage A      Created: Tue Feb 10, 2013 11:59 AM       she has AGUS on her pap.  She needs a colpo and an Endo Bx Appointment is 04/17 @1 :00            Thank you ------

## 2013-02-12 ENCOUNTER — Telehealth: Payer: Self-pay | Admitting: General Practice

## 2013-02-12 NOTE — Telephone Encounter (Signed)
Patient called and left message stating she just has a couple questions about a procedure she is having done on the 17th

## 2013-02-12 NOTE — Telephone Encounter (Signed)
Called Ashley Fitzpatrick and answered her questions about colposcopy and endometrial biopsy. Patient voices understanding.

## 2013-02-26 ENCOUNTER — Encounter: Payer: BC Managed Care – PPO | Admitting: Obstetrics & Gynecology

## 2013-03-18 ENCOUNTER — Ambulatory Visit (INDEPENDENT_AMBULATORY_CARE_PROVIDER_SITE_OTHER): Payer: BC Managed Care – PPO | Admitting: Obstetrics & Gynecology

## 2013-03-18 ENCOUNTER — Other Ambulatory Visit (HOSPITAL_COMMUNITY)
Admission: RE | Admit: 2013-03-18 | Discharge: 2013-03-18 | Disposition: A | Discharge status: Home / Self Care | Payer: BC Managed Care – PPO | Source: Ambulatory Visit | Attending: Obstetrics & Gynecology | Admitting: Obstetrics & Gynecology

## 2013-03-18 ENCOUNTER — Encounter: Payer: Self-pay | Admitting: Obstetrics & Gynecology

## 2013-03-18 VITALS — BP 106/69 | HR 76 | Resp 20 | Ht 62.0 in | Wt 144.4 lb

## 2013-03-18 DIAGNOSIS — R87619 Unspecified abnormal cytological findings in specimens from cervix uteri: Secondary | ICD-10-CM

## 2013-03-18 DIAGNOSIS — IMO0002 Reserved for concepts with insufficient information to code with codable children: Secondary | ICD-10-CM

## 2013-03-18 DIAGNOSIS — R87610 Atypical squamous cells of undetermined significance on cytologic smear of cervix (ASC-US): Secondary | ICD-10-CM | POA: Insufficient documentation

## 2013-03-18 NOTE — Patient Instructions (Addendum)
Endometrial Biopsy This is a test in which a tissue sample (a biopsy) is taken from inside the uterus (womb). It is then looked at by a specialist under a microscope to see if the tissue is normal or abnormal. The endometrium is the lining of the uterus. This test helps determine where you are in your menstrual cycle and how hormone levels are affecting the lining of the uterus. Another use for this test is to diagnose endometrial cancer, tuberculosis, polyps, or inflammatory conditions and to evaluate uterine bleeding. PREPARATION FOR TEST No preparation or fasting is necessary. NORMAL FINDINGS No pathologic conditions. Presence of "secretory-type" endometrium 3 to 5 days before to normal menstruation. Ranges for normal findings may vary among different laboratories and hospitals. You should always check with your doctor after having lab work or other tests done to discuss the meaning of your test results and whether your values are considered within normal limits. MEANING OF TEST  Your caregiver will go over the test results with you and discuss the importance and meaning of your results, as well as treatment options and the need for additional tests if necessary. OBTAINING THE TEST RESULTS It is your responsibility to obtain your test results. Ask the lab or department performing the test when and how you will get your results. Document Released: 03/01/2005 Document Revised: 01/21/2012 Document Reviewed: 10/08/2008 [facility] Patient Information 2013 [place], [place]. Colposcopy Colposcopy is a procedure that uses a special lighted microscope (colposcope). It examines your cervix and vagina, or the area around the outside of the vagina, for signs of disease or abnormalities in the cells. You may be sent to a specialist (gynecologist) to do the colposcopy. A biopsy (tissue sample) may be collected during a colposcopy, if the caregiver finds any unusual cells. The biopsy is sent to the lab for further  testing, and the results are reported back to your caregiver. A WOMAN MAY NEED THIS PROCEDURE IF:  She has had an abnormal pap smear (taking cells from the cervix for testing).  She has a sore on her cervix, and a Pap test was normal.  The Pap test suggests human papilloma virus (HPV). This virus can cause genital warts and is linked to the development of cervical cancer.  She has genital warts on the cervix, or in or around the outside of the vagina.  Her mother took the drug DES while pregnant.  She has painful intercourse.  She has vaginal bleeding, especially after sexual intercourse.  There is a need to evaluate the results of previous treatment. BEFORE THE PROCEDURE   Colposcopy is done when you are not having a menstrual period.  For 24 hours before the colposcopy, do not:  Douche.  Use tampons.  Use medicines, creams, or suppositories in the vagina.  Have sexual intercourse. PROCEDURE   A colposcopy is done while a woman is lying on her back with her feet in foot rests (stirrups).  A speculum is placed inside the vagina to keep it open and to allow the caregiver to see the cervix. This is the same instrument used to do a pap smear.  The colposcope is placed outside the vagina. It is used to magnify and examine the cervix, vagina, and the area around the outside of the vagina.  A small amount of liquid solution is placed on the area that is to be viewed. This solution is placed on with a cotton applicator. This solution makes it easier to see the abnormal cells.  Your caregiver will  suck out mucus and cells from the canal of the cervix.  Small pieces of tissue for biopsy may be taken at the same time. You may feel mild pain or discomfort when this is done.  Your caregiver will record the location of the abnormal areas and send the tissue samples to a lab for analysis.  If your caregiver biopsies the vagina or outside of the vagina, a local anesthetic (novocaine)  is usually given. AFTER THE PROCEDURE   You may have some cramping that often goes away in a few minutes. You may have some soreness for a couple of days.  You may take over-the-counter pain medicine as advised by your caregiver. Do not take aspirin because it can cause bleeding.  Lie down for a few minutes if you feel lightheaded.  You may have some bleeding or dark discharge that should stop in a few days.  You may need to wear a sanitary pad for a few days. HOME CARE INSTRUCTIONS   Avoid sex, douching, and using tampons for a week or as directed.  Only take medicine as directed by your caregiver.  Continue to take birth control pills, if you are on them.  Not all test results are available during your visit. If your test results are not back during the visit, make an appointment with your caregiver to find out the results. Do not assume everything is normal if you have not heard from your caregiver or the medical facility. It is important for you to follow up on all of your test results.  Follow your caregiver's advice regarding medicines, activity, follow-up visits, and follow-up Pap tests. SEEK MEDICAL CARE IF:   You develop a rash.  You have problems with your medicine. SEEK IMMEDIATE MEDICAL CARE IF:  You are bleeding heavily or are passing blood clots.  You develop a fever over 102 F (38.9 C), with or without chills.  You have abnormal vaginal discharge.  You are having cramps that do not go away after taking your pain medicine.  You feel lightheaded, dizzy, or faint.  You develop stomach pain. Document Released: 01/19/2003 Document Revised: 01/21/2012 Document Reviewed: 09/01/2009 [facility] Patient Information 2013 [place], [place]. Abnormal Pap Test Information During a Pap test, the cells on the surface of your cervix are checked to see if they look normal, abnormal, or if they show signs of having been altered by a certain type of virus called human  papillomavirus, or HPV. Cervical cells that have been affected by HPV are called dysplasia. Dysplasia is not cancer, but describes abnormal cells found on the surface of the cervix. Depending on the degree of dysplasia, some of the cells may be considered pre-cancerous and may turn into cancer over time if follow up with a caregiver is delayed.  WHAT DOES AN ABNORMAL PAP TEST MEAN? Having an abnormal pap test does not mean that you have cancer. However, certain types of abnormal pap tests can be a sign that a person is at a higher risk of developing cancer. Your caregiver will want to do other tests to find out more about the abnormal cells. Your abnormal Pap test results could show:   Small and uncertain changes that should be carefully watched.   Cervical dysplasia that has caused mild changes and can be followed over time.  Cervical dysplasia that is more severe and needs to be followed and treated to ensure the problem goes away.  Cancer.  When severe cervical dysplasia is found and treated early, it  rarely will grow into cancer.  WHAT WILL BE DONE ABOUT MY ABNORMAL PAP TEST?  A colposcopy may be needed. This is a procedure where your cervix is examined using light and magnification.  A small tissue sample of your cervix (biopsy) may need to be removed and then examined. This is often performed if there are areas that appear infected.  A sample of cells from the cervical canal may be removed with either a small brush or scraping instrument (curette). Based on the results of the procedures above, some caregivers may recommend either cryotherapy of the cervix or a surgical LEEP where a portion of the cervix is removed. LEEP is short for "loop electrical excisional procedure." Rarely, a caregiver may recommend a cone biopsy.This is a procedure where a small, cone-shaped sample of your cervix is taken out. The part that is taken out is the area where the abnormal cells are.  WHAT IF I HAVE  A DYSPLASIA OR A CANCER? You may be referred to a specialist. Radiation may also be a treatment for more advanced cancer. Having a hysterectomy is the last treatment option for dysplasia, but it is a more common treatment for someone with cancer. All treatment options will be discussed with you by your caregiver. WHAT SHOULD YOU DO AFTER BEING TREATED? If you have had an abnormal pap test, you should continue to have regular pap tests and check-ups as directed by your caregiver. Your cervical problem will be carefully watched so it does not get worse. Also, your caregiver can watch for, and treat, any new problems that may come up. Document Released: 02/13/2011 Document Revised: 01/21/2012 Document Reviewed: 10/25/2011 [facility] Patient Information 2013 [place], [place].

## 2013-03-18 NOTE — Progress Notes (Signed)
Patient ID: Ashley Fitzpatrick, female   DOB: February 03, 1971, 42 y.o.   MRN: [id number] Patient given informed consent, signed copy in the chart, time out was performed.  Placed in lithotomy position. Cervix viewed with speculum and colposcope after application of acetic acid.    01/30/2013 Adequacy Reason Satisfactory for evaluation, endocervical/transformation zone component PRESENT. Diagnosis ATYPICAL GLANDULAR CELLS, SEE COMMENT. COMMENT: ENDOCERVICAL ORIGIN FAVORED.  The indications for endometrial biopsy were reviewed.   Risks of the biopsy including cramping, bleeding, infection, uterine perforation, inadequate specimen and need for additional procedures  were discussed. The patient states she understands and agrees to undergo procedure today. Consent was signed. Time out was performed. Urine HCG was negative. A sterile speculum was placed in the patient's vagina and the cervix was prepped with Betadine. A single-toothed tenaculum was placed on the anterior lip of the cervix to stabilize it. The 3 mm pipelle was introduced into the endometrial cavity without difficulty to a depth of 9cm, and a moderate amount of tissue was obtained and sent to pathology. The instruments were removed from the patient's vagina. Minimal bleeding from the cervix was noted.   Colposcopy adequate? Yes after opening scar tissue from the cervix using the endocervical speculum Acetowhite lesions?no Punctation?no Mosaicism? no Abnormal vasculature?  no Biopsies?yes endo bx ECC?yes    Patient was given post procedure instructions.  She will return in 4 weeks for results.  Maimouna Rondeau L. Harraway-Smith, M.D., [facility]

## 2013-03-24 ENCOUNTER — Telehealth: Payer: Self-pay | Admitting: *Deleted

## 2013-03-24 NOTE — Telephone Encounter (Addendum)
Message copied by Jill Side on Tue Mar 24, 2013  4:13 PM ------      Message from: Willodean Rosenthal      Created: Tue Mar 24, 2013  1:47 PM       Please call pt.  Both of her biopsies (endometrium and endocervix) were benign.  Rec repeat and HPV  in 1 year.            Thx,      clh-S       ------Called pt and informed her of test results as well as recommendation to have repeat Pap w/HPV testing in 1 year. Pt voiced understanding.

## 2013-05-01 ENCOUNTER — Encounter: Payer: Self-pay | Admitting: *Deleted

## 2013-10-15 ENCOUNTER — Telehealth: Payer: Self-pay | Admitting: *Deleted

## 2013-10-15 DIAGNOSIS — B009 Herpesviral infection, unspecified: Secondary | ICD-10-CM

## 2013-10-15 NOTE — Telephone Encounter (Signed)
10/15/2013 pt called nurse line request fax for medication refill to pharmacy.

## 2013-10-15 NOTE — Telephone Encounter (Signed)
Patient left message stating that she is returning our call and the name of the medicine is Acyclovir.

## 2013-10-16 MED ORDER — ACYCLOVIR 400 MG PO TABS: 400.0000 mg | ORAL_TABLET | Freq: Two times a day (BID) | ORAL | Status: DC

## 2013-10-16 NOTE — Telephone Encounter (Signed)
10/16/2013  Message left that we have taken care of request and to call her pharmacy.

## 2014-01-28 ENCOUNTER — Other Ambulatory Visit: Payer: Self-pay | Admitting: Obstetrics and Gynecology

## 2014-01-31 ENCOUNTER — Other Ambulatory Visit: Payer: Self-pay | Admitting: Obstetrics and Gynecology

## 2014-02-05 ENCOUNTER — Ambulatory Visit (INDEPENDENT_AMBULATORY_CARE_PROVIDER_SITE_OTHER): Payer: BC Managed Care – PPO | Admitting: Obstetrics & Gynecology

## 2014-02-05 VITALS — BP 120/76 | HR 69 | Temp 97.0°F | Ht 62.0 in | Wt 148.7 lb

## 2014-02-05 DIAGNOSIS — Z124 Encounter for screening for malignant neoplasm of cervix: Secondary | ICD-10-CM

## 2014-02-05 DIAGNOSIS — R5383 Other fatigue: Secondary | ICD-10-CM

## 2014-02-05 DIAGNOSIS — Z01419 Encounter for gynecological examination (general) (routine) without abnormal findings: Secondary | ICD-10-CM

## 2014-02-05 DIAGNOSIS — Z1231 Encounter for screening mammogram for malignant neoplasm of breast: Secondary | ICD-10-CM

## 2014-02-05 DIAGNOSIS — R5381 Other malaise: Secondary | ICD-10-CM

## 2014-02-05 LAB — CBC
HEMATOCRIT: 40.7 % (ref 36.0–46.0)
HEMOGLOBIN: 13.9 g/dL (ref 12.0–15.0)
MCH: 32.6 pg (ref 26.0–34.0)
MCHC: 34.2 g/dL (ref 30.0–36.0)
MCV: 95.5 fL (ref 78.0–100.0)
Platelets: 296 10*3/uL (ref 150–400)
RBC: 4.26 MIL/uL (ref 3.87–5.11)
RDW: 13.5 % (ref 11.5–15.5)
WBC: 5.6 10*3/uL (ref 4.0–10.5)

## 2014-02-05 LAB — TSH: TSH: 0.739 u[IU]/mL (ref 0.350–4.500)

## 2014-02-05 NOTE — Patient Instructions (Signed)
Perimenopause  Perimenopause is the time when your body begins to move into the menopause (no menstrual period for 12 straight months). It is a natural process. Perimenopause can begin 2 8 years before the menopause and usually lasts for 1 year after the menopause. During this time, your ovaries may or may not produce an egg. The ovaries vary in their production of estrogen and progesterone hormones each month. This can cause irregular menstrual periods, difficulty getting pregnant, vaginal bleeding between periods, and uncomfortable symptoms.  CAUSES  · Irregular production of the ovarian hormones, estrogen and progesterone, and not ovulating every month.  · Other causes include:  · Tumor of the pituitary gland in the brain.  · Medical disease that affects the ovaries.  · Radiation treatment.  · Chemotherapy.  · Unknown causes.  · Heavy smoking and excessive alcohol intake can bring on perimenopause sooner.  SIGNS AND SYMPTOMS   · Hot flashes.  · Night sweats.  · Irregular menstrual periods.  · Decreased sex drive.  · Vaginal dryness.  · Headaches.  · Mood swings.  · Depression.  · Memory problems.  · Irritability.  · Tiredness.  · Weight gain.  · Trouble getting pregnant.  · The beginning of losing bone cells (osteoporosis).  · The beginning of hardening of the arteries (atherosclerosis).  DIAGNOSIS   Your health care provider will make a diagnosis by analyzing your age, menstrual history, and symptoms. He or she will do a physical exam and note any changes in your body, especially your female organs. Female hormone tests may or may not be helpful depending on the amount of female hormones you produce and when you produce them. However, other hormone tests may be helpful to rule out other problems.  TREATMENT   In some cases, no treatment is needed. The decision on whether treatment is necessary during the perimenopause should be made by you and your health care provider based on how the symptoms are affecting you  and your lifestyle. Various treatments are available, such as:  · Treating individual symptoms with a specific medicine for that symptom.  · Herbal medicines that can help specific symptoms.  · Counseling.  · Group therapy.  HOME CARE INSTRUCTIONS   · Keep track of your menstrual periods (when they occur, how heavy they are, how long between periods, and how long they last) as well as your symptoms and when they started.  · Only take over-the-counter or prescription medicines as directed by your health care provider.  · Sleep and rest.  · Exercise.  · Eat a diet that contains calcium (good for your bones) and soy (acts like the estrogen hormone).  · Do not smoke.  · Avoid alcoholic beverages.  · Take vitamin supplements as recommended by your health care provider. Taking vitamin E may help in certain cases.  · Take calcium and vitamin D supplements to help prevent bone loss.  · Group therapy is sometimes helpful.  · Acupuncture may help in some cases.  SEEK MEDICAL CARE IF:   · You have questions about any symptoms you are having.  · You need a referral to a specialist (gynecologist, psychiatrist, or psychologist).  SEEK IMMEDIATE MEDICAL CARE IF:   · You have vaginal bleeding.  · Your period lasts longer than 8 days.  · Your periods are recurring sooner than 21 days.  · You have bleeding after intercourse.  · You have severe depression.  · You have pain when you urinate.  ·   You have severe headaches.  · You have vision problems.  Document Released: 12/06/2004 Document Revised: 08/19/2013 Document Reviewed: 05/28/2013  [employer]® Patient Information ©2014 [employer].

## 2014-02-05 NOTE — Progress Notes (Signed)
Patient ID: Ashley Fitzpatrick, female   DOB: 10-22-1971, 43 y.o.   MRN: [id number] Subjective:     Ashley Fitzpatrick is a 43 y.o. female here for a routine exam.  Current complaints: fatigue and recent frequent menses.  Personal health questionnaire reviewed: not asked.   Gynecologic History Patient's last menstrual period was 01/29/2014. Contraception: tubal ligation Last Pap: 01/2013. Results were: abnormal Last mammogram: 2014. Results were: normal  Obstetric History OB History  Gravida Para Term Preterm AB SAB TAB Ectopic Multiple Living  3 2 2  1 1    2     # Outcome Date GA Lbr Len/2nd Weight Sex Delivery Anes PTL Lv  3 SAB           2 TRM           1 TRM                The following portions of the patient's history were reviewed and updated as appropriate: allergies, current medications, past family history, past medical history, past social history, past surgical history and problem list.  Review of Systems Pertinent items are noted in HPI.    Objective:    BP 120/76  Pulse 69  Temp(Src) 97 F (36.1 C) (Oral)  Ht 5\' 2"  (1.575 m)  Wt 148 lb 11.2 oz (67.45 kg)  BMI 27.19 kg/m2  LMP 01/29/2014  General Appearance:    Alert, cooperative, no distress, appears stated age  Head:    Normocephalic, without obvious abnormality, atraumatic           Throat:   Lips, mucosa, and tongue normal; teeth and gums normal  Neck:   Supple, symmetrical, trachea midline, no adenopathy;    thyroid:  no enlargement/tenderness/nodules; no carotid   bruit or JVD  Back:     Symmetric, no curvature, ROM normal, no CVA tenderness     Chest Wall:    No tenderness or deformity   Heart:    Regular rate and rhythm, S1 and S2 normal, no murmur, rub   or gallop  Breast Exam:    No tenderness, masses, or nipple abnormality  Abdomen:     Soft, non-tender, bowel sounds active all four quadrants,    no masses, no organomegaly  Genitalia:    Normal female without lesion, discharge or tenderness   Pap done   Extremities:   Extremities normal, atraumatic, no cyanosis or edema     Skin:   Skin color, texture, turgor normal, no rashes or lesions  Lymph nodes:   Cervical, supraclavicular, and axillary nodes normal         Assessment:    Healthy female exam.   Fatigue, irregular menses Plan:    Education reviewed: perimenopause.    CBC and TSH. F/u pap result  02/05/2014 Ashley PhenixJames G Marionette Meskill, MD

## 2014-02-10 ENCOUNTER — Telehealth: Payer: Self-pay | Admitting: *Deleted

## 2014-02-10 NOTE — Telephone Encounter (Signed)
Patient called nurse line requesting results from this past Friday 3/27.  Spoke with patient concerning results, patient has no further questions.

## 2014-02-12 ENCOUNTER — Ambulatory Visit (HOSPITAL_COMMUNITY): Payer: BC Managed Care – PPO

## 2014-02-19 ENCOUNTER — Ambulatory Visit (HOSPITAL_COMMUNITY): Payer: BC Managed Care – PPO

## 2014-02-26 ENCOUNTER — Ambulatory Visit (HOSPITAL_COMMUNITY)
Admission: RE | Admit: 2014-02-26 | Discharge: 2014-02-26 | Disposition: A | Discharge status: Home / Self Care | Payer: BC Managed Care – PPO | Source: Ambulatory Visit | Attending: Obstetrics & Gynecology | Admitting: Obstetrics & Gynecology

## 2014-02-26 ENCOUNTER — Other Ambulatory Visit: Payer: Self-pay | Admitting: Obstetrics & Gynecology

## 2014-02-26 ENCOUNTER — Other Ambulatory Visit: Payer: Self-pay | Admitting: Obstetrics and Gynecology

## 2014-02-26 DIAGNOSIS — Z1231 Encounter for screening mammogram for malignant neoplasm of breast: Secondary | ICD-10-CM

## 2014-03-12 ENCOUNTER — Telehealth: Payer: Self-pay | Admitting: *Deleted

## 2014-03-12 NOTE — Telephone Encounter (Signed)
Pt left message stating that she had Mammogram 2 weeks ago and would like to know the results.

## 2014-03-15 NOTE — Telephone Encounter (Signed)
Called patient and left voicemail that we are returning her call, if she still needs to speak with us please call us back. Per note on mammogram results letter was mailed to patient.

## 2014-04-22 ENCOUNTER — Other Ambulatory Visit: Payer: Self-pay | Admitting: Obstetrics and Gynecology

## 2014-04-22 ENCOUNTER — Telehealth: Payer: Self-pay | Admitting: General Practice

## 2014-04-22 DIAGNOSIS — B001 Herpesviral vesicular dermatitis: Secondary | ICD-10-CM

## 2014-04-22 MED ORDER — ACYCLOVIR 400 MG PO TABS: 400.0000 mg | ORAL_TABLET | Freq: Three times a day (TID) | ORAL | Status: AC

## 2014-04-22 NOTE — Telephone Encounter (Signed)
Patient called and left message that she needs a Rx refill. Called patient back and she states she needs a refill on her acyclovir, told patient we would send that to her pharmacy, patient verbalized understanding. 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 Called patient back to discuss change in frequency of medication, no answer- left message that she will need to get this medicine from her primary care doctor in the future and to only take the medicine when she needs it 3 times a day for 5 days and she can call us back if she has any questions.

## 2014-07-26 ENCOUNTER — Other Ambulatory Visit: Payer: Self-pay | Admitting: Medical

## 2014-07-29 ENCOUNTER — Telehealth: Payer: Self-pay | Admitting: General Practice

## 2014-07-29 NOTE — Telephone Encounter (Signed)
Called patient and discussed with her that per our last phone call in Ashley Fitzpatrick we had talked about her not talking the medication everyday but only taking it three times a day for 5 days WHEN she has an outbreak, not everyday. Patient verbalized understanding and states that she knows but she realized if she took it that way she realized something was immediately coming right back so she has just been taking it everyday twice a day because that's what she has always been prescribed. Asked patient if she had a primary care doctor and she states yeah but I haven't been in a while. Discussed with patient that she needs to see her pcp for a follow up yearly visit if she hasn't been in a while to check on her general health and to discuss the need for this prescription and to see what preventative things can be done or possibly a referral to dermatology. Discussed with patient that she has been on this everyday for 4 years which is a long time of continuous usage and that she needs to see someone who can manage this and follow up with her. Patient verbalized understanding and knows we will not be giving her additional refills. Patient had no questions at this time

## 2014-09-13 ENCOUNTER — Encounter: Payer: Self-pay | Admitting: Obstetrics & Gynecology

## 2014-12-24 ENCOUNTER — Ambulatory Visit: Payer: Self-pay | Admitting: Obstetrics & Gynecology

## 2016-10-29 ENCOUNTER — Encounter: Payer: Self-pay | Admitting: Obstetrics & Gynecology

## 2016-10-29 ENCOUNTER — Ambulatory Visit (INDEPENDENT_AMBULATORY_CARE_PROVIDER_SITE_OTHER): Payer: Self-pay | Admitting: Obstetrics & Gynecology

## 2016-10-29 DIAGNOSIS — N926 Irregular menstruation, unspecified: Secondary | ICD-10-CM | POA: Insufficient documentation

## 2016-10-29 LAB — TSH: TSH: 1.3 mIU/L

## 2016-10-29 NOTE — Progress Notes (Signed)
Patient ID: Ashley Fitzpatrick, female   DOB: Aug 28, 1971, 45 y.o.   MRN: [id number]  Chief Complaint  Patient presents with  . Menstrual Problem    irregular menses period before was in September     HPI Ashley Fitzpatrick is a 45 y.o. female.  [id number]  Patient's last menstrual period was 10/26/2016 (exact date). Menses 07/2106 followed by [facility] Patient's last menstrual period was 10/26/2016 (exact date). Notes weight gain, fatigue, insomnia and anxiety. No VMS noted HPI  Past Medical History:  Diagnosis Date  . Abnormal Pap smear     Past Surgical History:  Procedure Laterality Date  . CESAREAN SECTION  1991, 1993   [facility]  . TONSILLECTOMY    . WISDOM TOOTH EXTRACTION      Family History  Problem Relation Age of Onset  . Hypertension Father   . Spina bifida Sister   . Diabetes Sister     Social History Social History  Substance Use Topics  . Smoking status: Never Smoker  . Smokeless tobacco: Never Used  . Alcohol use Yes     Comment: social drinking    Allergies  Allergen Reactions  . Sulfa Antibiotics     Current Outpatient Prescriptions  Medication Sig Dispense Refill  . acyclovir (ZOVIRAX) 400 MG tablet TAKE ONE TABLET BY MOUTH TWICE DAILY 60 tablet 0  . ciprofloxacin (CIPRO) 500 MG tablet Take 500 mg by mouth 2 (two) times daily.    Marland Kitchen. acyclovir (ZOVIRAX) 400 MG tablet Take 1 tablet (400 mg total) by mouth 3 (three) times daily. Take 1 tablet TID for 5 days when outbreak occurs (Patient not taking: Reported on 10/29/2016) 60 tablet 2   No current facility-administered medications for this visit.     Review of Systems Review of Systems  Constitutional: Positive for fatigue and unexpected weight change.  Psychiatric/Behavioral: Positive for sleep disturbance.    Blood pressure (!) 144/80, pulse 83, weight 164 lb 14.4 oz (74.8 kg), last menstrual period 10/26/2016.  Physical Exam Physical Exam  Constitutional: She is oriented to person, place, and time. She  appears well-developed. No distress.  Cardiovascular: Normal rate.   Pulmonary/Chest: Effort normal.  Neurological: She is alert and oriented to person, place, and time.  Psychiatric: She has a normal mood and affect. Her behavior is normal.  Vitals reviewed.   Data Reviewed Pap result 2015 nl  Assessment    Irregular menses and perimenopausal sx    Plan    TSH,FSH, estrogen level RTC to review labs 4 weeks       Ashley Fitzpatrick 10/29/2016, 4:51 PM

## 2016-10-30 LAB — FOLLICLE STIMULATING HORMONE: FSH: 17.1 m[IU]/mL

## 2016-11-01 ENCOUNTER — Telehealth: Payer: Self-pay | Admitting: *Deleted

## 2016-11-01 NOTE — Telephone Encounter (Signed)
Returned pt's call and informed her of normal lab results. I advised that these results have not been reviewed by Dr. Debroah LoopArnold. If he has additional information or advice, we will call her back. Pt voiced understanding..Marland Kitchen

## 2016-11-01 NOTE — Telephone Encounter (Signed)
Patient left message on nurse voicemail on 11/01/16 at 0905.  States she had lab work done on Monday and would like the results.  Requests a return call.

## 2016-11-02 LAB — ESTROGENS, TOTAL: Estrogen: 155.9 pg/mL

## 2016-11-08 ENCOUNTER — Encounter: Payer: Self-pay | Admitting: General Practice

## 2024-07-17 ENCOUNTER — Other Ambulatory Visit: Payer: Self-pay | Admitting: Medical Genetics

## 2024-07-28 ENCOUNTER — Other Ambulatory Visit: Payer: Self-pay

## 2024-09-15 ENCOUNTER — Other Ambulatory Visit: Payer: Self-pay

## 2024-09-15 DIAGNOSIS — Z006 Encounter for examination for normal comparison and control in clinical research program: Secondary | ICD-10-CM

## 2024-09-29 LAB — GENECONNECT MOLECULAR SCREEN: Genetic Analysis Overall Interpretation: NEGATIVE
